# Patient Record
Sex: Female | Born: 1985 | Race: White | Hispanic: No | Marital: Married | Smoking: Never smoker
Health system: Southern US, Community
[De-identification: ages and names within clinical notes are randomized; demographics above are authoritative.]

## PROBLEM LIST (undated history)

## (undated) ENCOUNTER — Inpatient Hospital Stay: Payer: Self-pay

---
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
## 2006-02-16 ENCOUNTER — Inpatient Hospital Stay: Payer: Self-pay | Admitting: Obstetrics and Gynecology

## 2008-02-23 ENCOUNTER — Emergency Department: Payer: Self-pay | Admitting: Emergency Medicine

## 2008-03-03 ENCOUNTER — Ambulatory Visit: Payer: Self-pay

## 2010-05-03 ENCOUNTER — Inpatient Hospital Stay (HOSPITAL_COMMUNITY)
Admission: AD | Admit: 2010-05-03 | Discharge: 2010-05-03 | Disposition: A | Discharge status: Home / Self Care | Payer: Medicaid Other | Source: Ambulatory Visit | Attending: Obstetrics and Gynecology | Admitting: Obstetrics and Gynecology

## 2010-05-03 DIAGNOSIS — R109 Unspecified abdominal pain: Secondary | ICD-10-CM | POA: Insufficient documentation

## 2010-05-03 DIAGNOSIS — O4700 False labor before 37 completed weeks of gestation, unspecified trimester: Secondary | ICD-10-CM | POA: Insufficient documentation

## 2010-05-07 ENCOUNTER — Inpatient Hospital Stay (HOSPITAL_COMMUNITY)
Admission: AD | Admit: 2010-05-07 | Discharge: 2010-05-08 | Disposition: A | Discharge status: Home / Self Care | Payer: Medicaid Other | Source: Ambulatory Visit | Attending: Obstetrics and Gynecology | Admitting: Obstetrics and Gynecology

## 2010-05-07 DIAGNOSIS — O4700 False labor before 37 completed weeks of gestation, unspecified trimester: Secondary | ICD-10-CM | POA: Insufficient documentation

## 2010-05-28 ENCOUNTER — Inpatient Hospital Stay (HOSPITAL_COMMUNITY)
Admission: RE | Admit: 2010-05-28 | Discharge: 2010-05-29 | DRG: 775 | Disposition: A | Discharge status: Home / Self Care | Payer: Medicaid Other | Source: Ambulatory Visit | Attending: Obstetrics and Gynecology | Admitting: Obstetrics and Gynecology

## 2010-05-28 LAB — CBC
HCT: 33.9 % — ABNORMAL LOW (ref 36.0–46.0)
Hemoglobin: 10.9 g/dL — ABNORMAL LOW (ref 12.0–15.0)
RBC: 3.64 MIL/uL — ABNORMAL LOW (ref 3.87–5.11)
WBC: 8.7 10*3/uL (ref 4.0–10.5)

## 2010-05-28 LAB — ABO/RH: ABO/RH(D): O NEG

## 2010-05-28 LAB — RPR: RPR Ser Ql: NONREACTIVE

## 2010-05-29 LAB — CBC
HCT: 29.9 % — ABNORMAL LOW (ref 36.0–46.0)
Hemoglobin: 9.6 g/dL — ABNORMAL LOW (ref 12.0–15.0)
WBC: 10.3 10*3/uL (ref 4.0–10.5)

## 2010-06-03 ENCOUNTER — Inpatient Hospital Stay (HOSPITAL_COMMUNITY): Admission: AD | Admit: 2010-06-03 | Payer: Self-pay | Admitting: Obstetrics and Gynecology

## 2010-06-10 NOTE — Discharge Summary (Signed)
  Kimberly Riggs, Kimberly Riggs            ACCOUNT NO.:  [id number]  MEDICAL RECORD NO.:  [id number]           PATIENT TYPE:  I  LOCATION:  9119                          FACILITY:  [facility]  PHYSICIAN:  Huel Cote, M.D. DATE OF BIRTH:  [birth year]  DATE OF ADMISSION:  05/28/2010 DATE OF DISCHARGE:  05/29/2010                              DISCHARGE SUMMARY   DISCHARGE DIAGNOSES: 1. Term pregnancy at 39 weeks delivered. 2. Status post normal spontaneous vaginal delivery.  DISCHARGE MEDICATIONS:  Motrin 600 mg p.o. every 6 hours.  The patient received RhoGAM postpartum as per protocol.  DISCHARGE FOLLOWUP:  The patient is to follow up in the office in 6 weeks for her full postpartum exam where she may proceed with a Mirena IUD.  HOSPITAL COURSE:  The patient is a 25 year old G2, P 1-0-0-1 who was admitted at 39-1/7 weeks' gestation for induction of labor with advanced cervical dilation to 5 cm.  Her prenatal care was uneventful.  She received RhoGAM at 28 weeks for her Rh negative status and was rubella equivocal.  Her prenatal labs are as follows; O negative, antibody negative, rubella equivocal, RPR nonreactive, hepatitis B surface antigen negative, HIV negative, GC negative, Chlamydia negative, cystic fibrosis negative. First trimester screen was normal, 1-hour Glucola normal at 132.  Alpha- fetoprotein normal and group B strep negative.  The patient's past obstetrical history was significant for a vaginal delivery in 2007, 6-pound 12-ounce infant.  PAST GYN HISTORY:  Remote history of abnormal Pap smears which resolved.  PAST SURGICAL HISTORY:  None.  PAST MEDICAL HISTORY:  None.  ALLERGIES:  None.  MEDICATIONS:  Prenatal vitamins.  SOCIAL HISTORY:  She is married with no tobacco, alcohol or drugs.  FAMILY HISTORY:  She has a history of diabetes in the family.  On admission, fetal heart rate is reactive.  Cervix was 80, 5-6, and -1 station.  She had rupture of membranes  performed and received an epidural shortly thereafter.  After her epidural, She progressed rapidly to complete, pushed well for about 30 minutes with a normal spontaneous vaginal delivery of a viable female infant over small second-degree laceration.  Apgars were 8 and 9, weight was 7 pounds 12 ounces. Placenta delivery was spontaneous.  Second-degree laceration was repaired with 3-0 Vicryl Rapide and she was given a local block of 1% plain Lidocaine to help with pain control.  Cervix and rectum intact. Estimated blood loss was 350 mL.  She had a normal postpartum course and on postpartum day #1 was feeling well.  Hemoglobin was 9.6.  Fundus was firm and lochia decreased.  She felt stable for discharge home and requested early discharge if her baby was able to go.  She was given prescriptions for Motrin and advised on follow up in 6 weeks for her postpartum exam.  She was also given instructions on pelvic rest and will call to make her appointment for her 6-week checkup.     Huel Cote, M.D.     KR/MEDQ  D:  05/29/2010  T:  05/29/2010  Job:  [id number]  Electronically Signed by Huel Cote M.D. on 06/09/2010 10:47:30 AM

## 2012-11-26 ENCOUNTER — Inpatient Hospital Stay: Payer: Self-pay | Admitting: Obstetrics and Gynecology

## 2012-11-26 LAB — CBC WITH DIFFERENTIAL/PLATELET
Basophil %: 0.6 %
Eosinophil %: 0.3 %
HGB: 11.2 g/dL — ABNORMAL LOW (ref 12.0–16.0)
Lymphocyte #: 1.9 10*3/uL (ref 1.0–3.6)
Monocyte #: 0.6 x10 3/mm (ref 0.2–0.9)
Neutrophil #: 8.7 10*3/uL — ABNORMAL HIGH (ref 1.4–6.5)
Neutrophil %: 76.8 %
RDW: 15.3 % — ABNORMAL HIGH (ref 11.5–14.5)
WBC: 11.3 10*3/uL — ABNORMAL HIGH (ref 3.6–11.0)

## 2012-11-27 LAB — HEMATOCRIT: HCT: 33.4 % — ABNORMAL LOW (ref 35.0–47.0)

## 2014-10-19 LAB — OB RESULTS CONSOLE GC/CHLAMYDIA
CHLAMYDIA, DNA PROBE: NEGATIVE
GC PROBE AMP, GENITAL: NEGATIVE

## 2014-10-19 LAB — OB RESULTS CONSOLE HEPATITIS B SURFACE ANTIGEN: Hepatitis B Surface Ag: NEGATIVE

## 2014-10-19 LAB — OB RESULTS CONSOLE HIV ANTIBODY (ROUTINE TESTING): HIV: NONREACTIVE

## 2014-10-19 LAB — OB RESULTS CONSOLE RPR: RPR: NONREACTIVE

## 2015-02-25 NOTE — L&D Delivery Note (Signed)
Delivery Note At 12:11 AM a viable female was delivered via Vaginal, Spontaneous Delivery (Presentation:OA;LOA).  APGAR: 8,9; weight 3010g.   Placenta status: Intact, Spontaneous.  Cord: 3 vessels with the following complications: None.  Cord pH: NA  Called to see patient.  Mom pushed to delivery viable female infant.  The head followed by shoulders, which delivered without difficulty, and the rest of the body.  No nuchal cord noted.  Baby to mom's chest.  Cord clamped and cut after > 5 min delay.  Cord blood obtained.  Placenta delivered spontaneously, intact, with a 3-vessel cord.  Small first degree laceration hemostatic without repair.  All counts correct.  Hemostasis obtained with IV pitocin and fundal massage. EBL 100 mL.    Anesthesia: None  Episiotomy: None  Lacerations: small hemostatic 1st degree no repair Suture Repair: NA Est. Blood Loss (mL): 100  Mom to postpartum.  Baby to Couplet care / Skin to Skin.  Tresea MallGLEDHILL,Liston Thum, CNM  This patient and plan were discussed with Dr Elesa MassedWard 06/04/2015

## 2015-04-11 ENCOUNTER — Ambulatory Visit
Admission: RE | Admit: 2015-04-11 | Discharge: 2015-04-11 | Disposition: A | Discharge status: Home / Self Care | Payer: BLUE CROSS/BLUE SHIELD | Source: Ambulatory Visit | Attending: Obstetrics & Gynecology | Admitting: Obstetrics & Gynecology

## 2015-04-11 ENCOUNTER — Encounter: Payer: Self-pay | Admitting: Radiology

## 2015-04-11 ENCOUNTER — Other Ambulatory Visit: Payer: Self-pay | Admitting: Obstetrics & Gynecology

## 2015-04-11 DIAGNOSIS — D72829 Elevated white blood cell count, unspecified: Secondary | ICD-10-CM | POA: Insufficient documentation

## 2015-04-11 DIAGNOSIS — R1031 Right lower quadrant pain: Secondary | ICD-10-CM

## 2015-04-11 DIAGNOSIS — Z3A29 29 weeks gestation of pregnancy: Secondary | ICD-10-CM | POA: Diagnosis not present

## 2015-04-11 DIAGNOSIS — R1030 Lower abdominal pain, unspecified: Secondary | ICD-10-CM | POA: Insufficient documentation

## 2015-05-18 ENCOUNTER — Encounter: Payer: Self-pay | Admitting: *Deleted

## 2015-05-18 ENCOUNTER — Observation Stay
Admission: EM | Admit: 2015-05-18 | Discharge: 2015-05-18 | Disposition: A | Discharge status: Home / Self Care | Payer: BLUE CROSS/BLUE SHIELD | Attending: Obstetrics & Gynecology | Admitting: Obstetrics & Gynecology

## 2015-05-18 DIAGNOSIS — Z3A35 35 weeks gestation of pregnancy: Secondary | ICD-10-CM | POA: Diagnosis not present

## 2015-05-18 DIAGNOSIS — O09213 Supervision of pregnancy with history of pre-term labor, third trimester: Secondary | ICD-10-CM | POA: Insufficient documentation

## 2015-05-18 MED ORDER — ACETAMINOPHEN 325 MG PO TABS: 650.0000 mg | ORAL_TABLET | ORAL | Status: DC | PRN

## 2015-05-18 MED ORDER — ONDANSETRON HCL 4 MG/2ML IJ SOLN: 4.0000 mg | Freq: Four times a day (QID) | INTRAMUSCULAR | Status: DC | PRN

## 2015-05-18 NOTE — Final Progress Note (Signed)
Physician Final Progress Note  Patient ID: Kimberly BollmanHeather N Ishida MRN: [id number] DOB/AGE: [birth year] 28 y.o.  Admit date: 05/18/2015 Admitting provider: Nadara Mustardobert P Asuzena Weis, MD Discharge date: 05/18/2015  Admission Diagnoses: Advanced Cervical Dilation, Contractions, Preterm Labor  Discharge Diagnoses:  Principal Problem:   Preterm labor in second trimester with preterm delivery in third trimester   Consults: None  Significant Findings/ Diagnostic Studies: 30 yo G4P3 at 35 weeks with ctxs today and ACD in office (3/50/-2). Prior term NSVD, but has had ACD at 34-37 weeks before (5cm last preg, delivered at 38 weeks.) No bleeding, ROM; ctxs mild (4/10). Exam here- irreg ctx pattern, but at times q 3-6 min apart    Cervix unchanged    FHT- 140s, see NST note  Procedures: A NST procedure was performed with FHR monitoring and a normal baseline established, appropriate time of 20-40 minutes of evaluation, and accels >2 seen w 15x15 characteristics.  Results show a REACTIVE NST.   Discharge Condition: good  Disposition: Weekly f/u and PRN  Diet: Regular diet  Discharge Activity: Activity as tolerated Monitor for signs or symptoms of labor    Medication List    Notice    Prenatal Vitamins.      Total time spent taking care of this patient: 15 minutes NST done  Signed: Letitia Libraobert Paul Odean Fester 05/18/2015, 4:24 PM

## 2015-05-18 NOTE — Plan of Care (Signed)
Discharge instructions, both oral and written, given to pt. Labor precautions discussed at length per MD and RN. Pt agrees with plan of care. [facility]. Pt leaving dept in stable condition ambulatory.  Ellison Carwin Damya Comley RNC

## 2015-05-18 NOTE — OB Triage Note (Signed)
[facility] with labor contractions 4 x hour per pt. She is 35 weeks and 3 cm,  50, -1 per C G CNM at 2 pm today. Pt is a G4 P3 pt EDC April 28. 35.0 weeks. Denies spotting, leaking. Active fetal movement per pt. And felt per RN at this time. Ellison Carwin Kimberly Riggs RNC

## 2015-05-18 NOTE — Discharge Instructions (Signed)
[facility].  Dr Tiburcio PeaHarris states to keep that appt. Labor procautions discussed at length per MD and RN. Pt agrees with plan of care and will call if any questions or concerns. Pt ready to leave dept in stable condition ambulatory. C Alyra Patty RND

## 2015-05-25 LAB — OB RESULTS CONSOLE GBS: STREP GROUP B AG: NEGATIVE

## 2015-06-03 ENCOUNTER — Inpatient Hospital Stay
Admission: EM | Admit: 2015-06-03 | Discharge: 2015-06-06 | DRG: 767 | Disposition: A | Discharge status: Home / Self Care | Payer: BLUE CROSS/BLUE SHIELD | Attending: Obstetrics & Gynecology | Admitting: Obstetrics & Gynecology

## 2015-06-03 DIAGNOSIS — Z302 Encounter for sterilization: Secondary | ICD-10-CM | POA: Diagnosis not present

## 2015-06-03 DIAGNOSIS — D62 Acute posthemorrhagic anemia: Secondary | ICD-10-CM | POA: Diagnosis not present

## 2015-06-03 DIAGNOSIS — O9081 Anemia of the puerperium: Secondary | ICD-10-CM | POA: Diagnosis not present

## 2015-06-03 DIAGNOSIS — Z3483 Encounter for supervision of other normal pregnancy, third trimester: Secondary | ICD-10-CM | POA: Diagnosis present

## 2015-06-03 LAB — CBC
HCT: 30.4 % — ABNORMAL LOW (ref 35.0–47.0)
Hemoglobin: 10.4 g/dL — ABNORMAL LOW (ref 12.0–16.0)
MCH: 31 pg (ref 26.0–34.0)
MCHC: 34.2 g/dL (ref 32.0–36.0)
MCV: 90.8 fL (ref 80.0–100.0)
PLATELETS: 206 10*3/uL (ref 150–440)
RBC: 3.34 MIL/uL — AB (ref 3.80–5.20)
RDW: 14.8 % — ABNORMAL HIGH (ref 11.5–14.5)
WBC: 9.9 10*3/uL (ref 3.6–11.0)

## 2015-06-03 MED ORDER — AMMONIA AROMATIC IN INHA
RESPIRATORY_TRACT | Status: AC
  Filled 2015-06-03: qty 10

## 2015-06-03 MED ORDER — LACTATED RINGERS IV SOLN: 500.0000 mL | INTRAVENOUS | Status: DC | PRN

## 2015-06-03 MED ORDER — OXYTOCIN BOLUS FROM INFUSION
500.0000 mL | INTRAVENOUS | Status: DC
  Administered 2015-06-04: 500 mL via INTRAVENOUS

## 2015-06-03 MED ORDER — LIDOCAINE HCL (PF) 1 % IJ SOLN
INTRAMUSCULAR | Status: AC
  Filled 2015-06-03: qty 30

## 2015-06-03 MED ORDER — ACETAMINOPHEN 325 MG PO TABS: 650.0000 mg | ORAL_TABLET | ORAL | Status: DC | PRN

## 2015-06-03 MED ORDER — LIDOCAINE HCL (PF) 1 % IJ SOLN: 30.0000 mL | INTRAMUSCULAR | Status: DC | PRN

## 2015-06-03 MED ORDER — BUTORPHANOL TARTRATE 1 MG/ML IJ SOLN: 1.0000 mg | INTRAMUSCULAR | Status: DC | PRN

## 2015-06-03 MED ORDER — MISOPROSTOL 200 MCG PO TABS
ORAL_TABLET | ORAL | Status: AC
  Filled 2015-06-03: qty 4

## 2015-06-03 MED ORDER — OXYTOCIN 40 UNITS IN LACTATED RINGERS INFUSION - SIMPLE MED
2.5000 [IU]/h | INTRAVENOUS | Status: DC
  Filled 2015-06-03: qty 1000

## 2015-06-03 MED ORDER — ONDANSETRON HCL 4 MG/2ML IJ SOLN: 4.0000 mg | Freq: Four times a day (QID) | INTRAMUSCULAR | Status: DC | PRN

## 2015-06-03 MED ORDER — LACTATED RINGERS IV SOLN
INTRAVENOUS | Status: DC
  Administered 2015-06-03: 22:00:00 via INTRAVENOUS

## 2015-06-03 MED ORDER — OXYTOCIN 10 UNIT/ML IJ SOLN
INTRAMUSCULAR | Status: AC
  Filled 2015-06-03: qty 2

## 2015-06-03 NOTE — H&P (Signed)
OB History & Physical   History of Present Illness:  Chief Complaint: Contractions  HPI:  Kimberly Riggs is a 30 y.o. [id number] female at 6984w2d dated by 8 wk US.  Her pregnancy has been without complication.    She reports contractions.   She denies leakage of fluid.   She denies vaginal bleeding.   She reports fetal movement.    Maternal Medical History:  History reviewed. No pertinent past medical history.  History reviewed. No pertinent past surgical history.  No Known Allergies  Prior to Admission medications   Not on File    OB History  Gravida Para Term Preterm AB SAB TAB Ectopic Multiple Living  4 3 3  0 0 0 0 0 0 1    # Outcome Date GA Lbr Len/2nd Weight Sex Delivery Anes PTL Lv  4 Current           3 Term     M Vag-Spont   Y  2 Term     M Vag-Spont     1 Term     F Vag-Spont         Prenatal care site: [facility]  Social History: She  reports that she has never smoked. She does not have any smokeless tobacco history on file.  Family History: family history is not on file.   Review of Systems: Negative x 10 systems reviewed except as noted in the HPI.    Physical Exam:  Vital Signs: BP 129/73 mmHg  Pulse 100  Temp(Src) 97.8 F (36.6 C)  Resp 18  Ht 5\' 4"  (1.626 m)  Wt 76.204 kg (168 lb)  BMI 28.82 kg/m2  LMP 09/15/2014 General: no acute distress.  HEENT: normocephalic, atraumatic Heart: regular rate & rhythm.  No murmurs/rubs/gallops Lungs: clear to auscultation bilaterally Abdomen: soft, gravid, non-tender;  EFW: 7.5 pounds Pelvic:   External: Normal external female genitalia  Cervix:  6 /  100 /  -1    Extremities: non-tender, symmetric, No edema bilaterally.  DTRs: 2+ bilaterally Neurologic: Alert & oriented x 3.    Pertinent Results:  Prenatal Labs: Blood type/Rh O negative  Antibody screen negative  Rubella Not immune  Varicella Not immune    RPR Non Reactive  HBsAg negative  HIV negative  GC negative  Chlamydia negative   Genetic screening Negative  1 hour GTT 138  3 hour GTT NA  GBS negative on 3/31   Baseline FHR: 140 beats/min   Variability: moderate   Accelerations: present   Decelerations: absent Contractions: present frequency: every 2-3 minutes Overall assessment: Cat I   Assessment:  Kimberly Riggs is a 30 y.o. 574P3003 female at 9184w2d in active labor.   Plan:  1. Admit to Labor & Delivery  2. CBC, T&S, Clrs, IVF 3. GBS negative.   4. Fetal well-being: Cat I 5. Expectant management for vaginal birth   Tresea MallGLEDHILL,Kevyn Wengert, CNM  This patient and plan were discussed with Dr Elesa MassedWard 06/03/2015

## 2015-06-03 NOTE — OB Triage Note (Signed)
Patient came in with complaint of contractions that started last night around 8:00. Patient rates pain  2 out of 10. Patient states she has been feeling baby move. Patient denies vaginal bleeding and discharge. Patient afebrile and vital signs stable. Husband at bedside. Will continue to monitor.

## 2015-06-04 ENCOUNTER — Encounter: Payer: Self-pay | Admitting: *Deleted

## 2015-06-04 LAB — CBC
HCT: 31 % — ABNORMAL LOW (ref 35.0–47.0)
Hemoglobin: 10.5 g/dL — ABNORMAL LOW (ref 12.0–16.0)
MCH: 31.2 pg (ref 26.0–34.0)
MCHC: 33.9 g/dL (ref 32.0–36.0)
MCV: 92.1 fL (ref 80.0–100.0)
PLATELETS: 201 10*3/uL (ref 150–440)
RBC: 3.37 MIL/uL — AB (ref 3.80–5.20)
RDW: 15.4 % — AB (ref 11.5–14.5)
WBC: 17 10*3/uL — AB (ref 3.6–11.0)

## 2015-06-04 LAB — TYPE AND SCREEN
ABO/RH(D): O NEG
Antibody Screen: POSITIVE

## 2015-06-04 MED ORDER — OXYCODONE HCL 5 MG PO TABS: 10.0000 mg | ORAL_TABLET | ORAL | Status: DC | PRN

## 2015-06-04 MED ORDER — ONDANSETRON HCL 4 MG PO TABS: 4.0000 mg | ORAL_TABLET | ORAL | Status: DC | PRN

## 2015-06-04 MED ORDER — DIBUCAINE 1 % RE OINT: 1.0000 "application " | TOPICAL_OINTMENT | RECTAL | Status: DC | PRN

## 2015-06-04 MED ORDER — SIMETHICONE 80 MG PO CHEW: 80.0000 mg | CHEWABLE_TABLET | ORAL | Status: DC | PRN

## 2015-06-04 MED ORDER — SODIUM CHLORIDE 0.9% FLUSH
3.0000 mL | Freq: Four times a day (QID) | INTRAVENOUS | Status: DC
  Administered 2015-06-04 – 2015-06-05 (×5): 3 mL via INTRAVENOUS

## 2015-06-04 MED ORDER — WITCH HAZEL-GLYCERIN EX PADS: 1.0000 "application " | MEDICATED_PAD | CUTANEOUS | Status: DC | PRN

## 2015-06-04 MED ORDER — OXYCODONE HCL 5 MG PO TABS: 5.0000 mg | ORAL_TABLET | ORAL | Status: DC | PRN

## 2015-06-04 MED ORDER — LANOLIN HYDROUS EX OINT: TOPICAL_OINTMENT | CUTANEOUS | Status: DC | PRN

## 2015-06-04 MED ORDER — ACETAMINOPHEN 325 MG PO TABS: 650.0000 mg | ORAL_TABLET | ORAL | Status: DC | PRN

## 2015-06-04 MED ORDER — DIPHENHYDRAMINE HCL 25 MG PO CAPS: 25.0000 mg | ORAL_CAPSULE | Freq: Four times a day (QID) | ORAL | Status: DC | PRN

## 2015-06-04 MED ORDER — SENNOSIDES-DOCUSATE SODIUM 8.6-50 MG PO TABS
2.0000 | ORAL_TABLET | ORAL | Status: DC
  Administered 2015-06-04 – 2015-06-06 (×2): 2 via ORAL
  Filled 2015-06-04 (×2): qty 2

## 2015-06-04 MED ORDER — IBUPROFEN 600 MG PO TABS
600.0000 mg | ORAL_TABLET | Freq: Four times a day (QID) | ORAL | Status: DC
  Administered 2015-06-04 – 2015-06-06 (×8): 600 mg via ORAL
  Filled 2015-06-04 (×8): qty 1

## 2015-06-04 MED ORDER — ONDANSETRON HCL 4 MG/2ML IJ SOLN
4.0000 mg | INTRAMUSCULAR | Status: DC | PRN
  Administered 2015-06-05: 4 mg via INTRAVENOUS

## 2015-06-04 MED ORDER — PRENATAL MULTIVITAMIN CH
1.0000 | ORAL_TABLET | Freq: Every day | ORAL | Status: DC
  Administered 2015-06-04: 1 via ORAL
  Filled 2015-06-04: qty 1

## 2015-06-04 MED ORDER — BENZOCAINE-MENTHOL 20-0.5 % EX AERO
1.0000 | INHALATION_SPRAY | CUTANEOUS | Status: DC | PRN
Start: 2015-06-04 — End: 2015-06-06
  Administered 2015-06-04: 1 via TOPICAL
  Filled 2015-06-04: qty 56

## 2015-06-04 NOTE — Discharge Summary (Addendum)
OB Discharge Summary  Patient Name: Kimberly Riggs DOB: Oct 19, 1985 MRN: [id number]  Date of admission: 06/03/2015 Delivering MD: Tresea Mall, CNM Date of Delivery: 06/04/2015  Date of discharge: 06/06/2015  Admitting diagnosis: contractions DESIRES PERMANENT STERILITY, active labor Intrauterine pregnancy: [redacted]w[redacted]d     Secondary diagnosis: None     Discharge diagnosis: Term Pregnancy Delivered                                                                                                Post partum procedures:postpartum tubal ligation  Augmentation: AROM  Complications: None  Hospital course:  Onset of Labor With Vaginal Delivery     30 y.o. yo [phone] at [redacted]w[redacted]d was admitted in Active Labor on 06/03/2015. Patient had an uncomplicated labor course as follows:  Membrane Rupture Time/Date: 9:38 PM ,06/03/2015   Intrapartum Procedures:  Episiotomy: None  Lacerations:  1st degree without repair  Patient had a delivery of a Viable infant 06/04/2015 Details of the delivery are found in a separate delivery note  Pateint had an uncomplicated postpartum course.  She is ambulating, tolerating a regular diet, passing flatus, and urinating well. Patient is discharged home in stable condition on 4/12. Marland Kitchen    Physical exam  Filed Vitals:   06/05/15 1820 06/05/15 1938 06/05/15 2012 06/06/15 0730  BP: 121/78 123/75 112/69 115/69  Pulse: 72 79 84 79  Temp: 98 F (36.7 C) 97.9 F (36.6 C) 98.7 F (37.1 C) 98.2 F (36.8 C)  TempSrc: Oral Oral Oral Oral  Resp: Height:      Weight:      SpO2: 100% 98% 99% 100%   General: alert, cooperative and no distress Lochia: appropriate Uterine Fundus: firm Incision: c/d/i with dermabond over it, no e/o infection of separation DVT Evaluation: No evidence of DVT seen on physical exam.  Labs: Lab Results  Component Value Date   WBC 17.0* 06/04/2015   HGB 10.5* 06/04/2015   HCT 31.0* 06/04/2015   MCV 92.1 06/04/2015   PLT 201  06/04/2015   No flowsheet data found.  Discharge instruction: per After Visit Summary.  Medications:    Medication List    TAKE these medications        docusate sodium 100 MG capsule  Commonly known as:  COLACE  Take 1 capsule (100 mg total) by mouth 2 (two) times daily.     ibuprofen 600 MG tablet  Commonly known as:  ADVIL,MOTRIN  Take 1 tablet (600 mg total) by mouth every 6 (six) hours as needed for headache, mild pain or cramping.     oxyCODONE 5 MG immediate release tablet  Commonly known as:  Oxy IR/ROXICODONE  Take 1 tablet (5 mg total) by mouth every 6 (six) hours as needed for severe pain.     prenatal multivitamin Tabs tablet  Take 1 tablet by mouth daily at 12 noon.        Diet: routine diet  Activity: Advance as tolerated. Pelvic rest for 6 weeks.   Outpatient follow up: No Follow-up on file.  Postpartum contraception: Tubal Ligation Rhogam Given  postpartum: no Rubella vaccine given postpartum: no Varicella vaccine given postpartum: no TDaP given antepartum or postpartum: TDaP given AP  Newborn Data: Live born female Birth Weight: 3010g APGAR:8,9    Baby Feeding: Breast  Disposition:home with mother  Cornelia Copaharlie Blayden Conwell, Jr MD [facility]  Pager: [phone]

## 2015-06-04 NOTE — Progress Notes (Signed)
30 year old G4 P4 s/p SVD earlier this AM-PPD0  S: Tired. Trying to breast feed-baby also sleppy Voided several times since delivery. Tolerating regular diet Bleeding WNL. Wants ppBTL  O: BP 109/63 mmHg  Pulse 84  Temp(Src) 98.4 F (36.9 C) (Oral)  Resp 15  Ht 5\' 4"  (1.626 m)  Wt 76.204 kg (168 lb)  BMI 28.82 kg/m2  SpO2 100%  LMP 09/15/2014  Breastfeeding? Unknown  General: tired, no acute distress, asking appropriate questions Results for orders placed or performed during the hospital encounter of 06/03/15 (from the past 24 hour(s))  CBC     Status: Abnormal   Collection Time: 06/03/15 10:08 PM  Result Value Ref Range   WBC 9.9 3.6 - 11.0 K/uL   RBC 3.34 (L) 3.80 - 5.20 MIL/uL   Hemoglobin 10.4 (L) 12.0 - 16.0 g/dL   HCT 45.430.4 (L) 09.835.0 - 11.947.0 %   MCV 90.8 80.0 - 100.0 fL   MCH 31.0 26.0 - 34.0 pg   MCHC 34.2 32.0 - 36.0 g/dL   RDW 14.714.8 (H) 82.911.5 - 56.214.5 %   Platelets 206 150 - 440 K/uL  Type and screen [facility]     Status: None   Collection Time: 06/03/15 10:08 PM  Result Value Ref Range   ABO/RH(D) O NEG    Antibody Screen POS    Sample Expiration 06/06/2015    Antibody Identification PASSIVELY ACQUIRED ANTI-D   CBC     Status: Abnormal   Collection Time: 06/04/15  4:50 AM  Result Value Ref Range   WBC 17.0 (H) 3.6 - 11.0 K/uL   RBC 3.37 (L) 3.80 - 5.20 MIL/uL   Hemoglobin 10.5 (L) 12.0 - 16.0 g/dL   HCT 13.031.0 (L) 86.535.0 - 78.447.0 %   MCV 92.1 80.0 - 100.0 fL   MCH 31.2 26.0 - 34.0 pg   MCHC 33.9 32.0 - 36.0 g/dL   RDW 69.615.4 (H) 29.511.5 - 28.414.5 %   Platelets 201 150 - 440 K/uL   A: stable PPD 0 Desires PPBTL  P: Scheduled for ppBTL in AM. NPO after midnight Saline lock IV and flush q6hr Counseled on permanence of BTL, failure rate, risk of ectopic if becomes pregnant, and risks of bleeding, infection, injury to other organs in the abdomen like bowel or bladder. Also discussed recovery time Patient wishes to proceed with surgery in  AM.  Lanie Schelling, CNM

## 2015-06-05 ENCOUNTER — Inpatient Hospital Stay: Payer: BLUE CROSS/BLUE SHIELD | Admitting: Anesthesiology

## 2015-06-05 ENCOUNTER — Encounter
Admission: EM | Disposition: A | Discharge status: Home / Self Care | Payer: Self-pay | Source: Home / Self Care | Attending: Obstetrics & Gynecology

## 2015-06-05 ENCOUNTER — Encounter: Payer: Self-pay | Admitting: *Deleted

## 2015-06-05 DIAGNOSIS — Z302 Encounter for sterilization: Secondary | ICD-10-CM

## 2015-06-05 HISTORY — PX: TUBAL LIGATION: SHX77

## 2015-06-05 LAB — RPR: RPR: NONREACTIVE

## 2015-06-05 SURGERY — LIGATION, FALLOPIAN TUBE, POSTPARTUM
Anesthesia: General | Site: Abdomen | Wound class: Clean Contaminated

## 2015-06-05 MED ORDER — PHENYLEPHRINE HCL 10 MG/ML IJ SOLN
INTRAMUSCULAR | Status: DC | PRN
  Administered 2015-06-05: 100 ug via INTRAVENOUS

## 2015-06-05 MED ORDER — FENTANYL CITRATE (PF) 100 MCG/2ML IJ SOLN
25.0000 ug | INTRAMUSCULAR | Status: DC | PRN
  Administered 2015-06-05 (×4): 25 ug via INTRAVENOUS

## 2015-06-05 MED ORDER — BUPIVACAINE-EPINEPHRINE 0.25% -1:200000 IJ SOLN
INTRAMUSCULAR | Status: DC | PRN
  Administered 2015-06-05: 5 mL

## 2015-06-05 MED ORDER — LACTATED RINGERS IV SOLN
INTRAVENOUS | Status: DC
  Administered 2015-06-05 (×2): via INTRAVENOUS

## 2015-06-05 MED ORDER — DEXAMETHASONE SODIUM PHOSPHATE 10 MG/ML IJ SOLN
INTRAMUSCULAR | Status: DC | PRN
  Administered 2015-06-05: 10 mg via INTRAVENOUS

## 2015-06-05 MED ORDER — PROPOFOL 10 MG/ML IV BOLUS
INTRAVENOUS | Status: DC | PRN
  Administered 2015-06-05: 160 mg via INTRAVENOUS

## 2015-06-05 MED ORDER — MIDAZOLAM HCL 2 MG/2ML IJ SOLN
INTRAMUSCULAR | Status: DC | PRN
Start: 2015-06-05 — End: 2015-06-05
  Administered 2015-06-05: 2 mg via INTRAVENOUS

## 2015-06-05 MED ORDER — KETOROLAC TROMETHAMINE 30 MG/ML IJ SOLN
INTRAMUSCULAR | Status: DC | PRN
  Administered 2015-06-05: 30 mg via INTRAVENOUS

## 2015-06-05 MED ORDER — FENTANYL CITRATE (PF) 100 MCG/2ML IJ SOLN
INTRAMUSCULAR | Status: AC
  Administered 2015-06-05: 25 ug via INTRAVENOUS
  Filled 2015-06-05: qty 2

## 2015-06-05 MED ORDER — ONDANSETRON HCL 4 MG/2ML IJ SOLN: 4.0000 mg | Freq: Once | INTRAMUSCULAR | Status: DC | PRN

## 2015-06-05 MED ORDER — ROCURONIUM BROMIDE 100 MG/10ML IV SOLN
INTRAVENOUS | Status: DC | PRN
  Administered 2015-06-05: 10 mg via INTRAVENOUS
  Administered 2015-06-05: 30 mg via INTRAVENOUS

## 2015-06-05 MED ORDER — FENTANYL CITRATE (PF) 100 MCG/2ML IJ SOLN
INTRAMUSCULAR | Status: DC | PRN
  Administered 2015-06-05: 50 ug via INTRAVENOUS
  Administered 2015-06-05: 100 ug via INTRAVENOUS

## 2015-06-05 MED ORDER — SUCCINYLCHOLINE CHLORIDE 20 MG/ML IJ SOLN
INTRAMUSCULAR | Status: DC | PRN
  Administered 2015-06-05: 100 mg via INTRAVENOUS

## 2015-06-05 MED ORDER — SUGAMMADEX SODIUM 200 MG/2ML IV SOLN
INTRAVENOUS | Status: DC | PRN
  Administered 2015-06-05: 152.4 mg via INTRAVENOUS

## 2015-06-05 SURGICAL SUPPLY — 25 items
BLADE SURG 11 STRL SS SAFETY (MISCELLANEOUS) ×3 IMPLANT
CHLORAPREP W/TINT 26ML (MISCELLANEOUS) ×3 IMPLANT
DRAPE LAPAROTOMY 100X77 ABD (DRAPES) ×3 IMPLANT
ELECT CAUTERY BLADE 6.4 (BLADE) ×3 IMPLANT
ELECT REM PT RETURN 9FT ADLT (ELECTROSURGICAL) ×3
ELECTRODE REM PT RTRN 9FT ADLT (ELECTROSURGICAL) ×1 IMPLANT
GLOVE BIO SURGEON STRL SZ7 (GLOVE) ×12 IMPLANT
GLOVE BIOGEL PI IND STRL 7.5 (GLOVE) ×4 IMPLANT
GLOVE BIOGEL PI INDICATOR 7.5 (GLOVE) ×8
GOWN STRL REUS W/ TWL LRG LVL3 (GOWN DISPOSABLE) ×3 IMPLANT
GOWN STRL REUS W/ TWL XL LVL3 (GOWN DISPOSABLE) ×1 IMPLANT
GOWN STRL REUS W/TWL LRG LVL3 (GOWN DISPOSABLE) ×6
GOWN STRL REUS W/TWL XL LVL3 (GOWN DISPOSABLE) ×2
KIT RM TURNOVER CYSTO AR (KITS) ×3 IMPLANT
LABEL OR SOLS (LABEL) IMPLANT
LIQUID BAND (GAUZE/BANDAGES/DRESSINGS) ×3 IMPLANT
NDL SAFETY 22GX1.5 (NEEDLE) ×3 IMPLANT
NS IRRIG 500ML POUR BTL (IV SOLUTION) ×3 IMPLANT
PACK BASIN MINOR ARMC (MISCELLANEOUS) ×3 IMPLANT
SUT PLAIN GUT 0 (SUTURE) ×6 IMPLANT
SUT VIC AB 2-0 UR6 27 (SUTURE) ×3 IMPLANT
SUT VIC AB 3-0 SH 27 (SUTURE) ×2
SUT VIC AB 3-0 SH 27X BRD (SUTURE) ×1 IMPLANT
SUT VIC AB 4-0 FS2 27 (SUTURE) ×3 IMPLANT
SYRINGE 10CC LL (SYRINGE) ×3 IMPLANT

## 2015-06-05 NOTE — Op Note (Signed)
Op Note Postpartum Tubal Ligation  Pre-Op Diagnosis: multiparity, desires permanent sterilization  Post-Op Diagnosis: multiparity, desires permanent sterilization  Procedures:  Postpartum tubal ligation via Pomeroy method  Primary Surgeon: Thomasene MohairStephen Crystle Carelli, MD  EBL: 10 ml   IVF: 1,000 mL   Specimens: portion of right and left fallopian tubes  Drains: None  Complications: None   Disposition: PACU   Condition: Stable   Findings: normal appearing bilateral fallopian tubes  Indication: The patient is a 30 y.o. [id number] who is postpartum day 1 status post spontaneous vaginal delivery.  She has been counseled extensively regarding risks, benefits, and alternatives to tubal ligation, including non-permanent forms of contraception that are equivalent in efficacy with potentially better side effects.  She has been advised that there is a failure rate of 3-5 in every 1,000 tubal ligations per year with an increased risk of ectopic pregnancy should pregnancy occur.   Procedure Summary:  The patient was taken to the operating room where general anesthesia was administered and found to be adequate. After timeout was called a small transverse, infraumbilical incision was made with the scalpel. The incision was carried down through the fascia until the peritoneum was identified and entered. The peritoneum was noted to be free of any adhesions and the incision was then extended.  The patient's right fallopian tube was identified, brought incision, and grasped with a Babcock clamp. The tube was then followed out to the fimbria. The Babcock clamp was then used to grasp the tube approximately 4 cm from the cornual region. A 3 cm segment of tube was then ligated with the 2 free ties of plain gut, and excised. Good hemostasis was noted and the tube was returned to the abdomen. The left fallopian tube was then ligated, and a 3 cm segment excised in a similar fashion. Excellent hemostasis was noted, and the  tube returned to the abdomen.  The peritoneum and fascia were closed in a single layer using 3-0 Vicryl. The skin was closed in a subcuticular fashion using 4-0 vicryl, undyed. The closure was also closed with Dermabond.  The patient tolerated the procedure well. Sponge, lap, and needle counts were correct 2. The patient was taken to the recovery room in stable condition.   Sponge, lap, needle, and instrument counts were correct x 2.  VTE prophylaxis: SCDs. Antibiotic prophylaxis: none indicated. The patient tolerated the procedure well and was taken to the PACU in stable condition.   Thomasene MohairStephen Pallavi Clifton, MD 06/05/2015 5:15 PM

## 2015-06-05 NOTE — Anesthesia Procedure Notes (Signed)
Procedure Name: Intubation Date/Time: 06/05/2015 4:23 PM Performed by: Junious SilkNOLES, Kimberly Wigley Pre-anesthesia Checklist: Patient identified, Patient being monitored, Timeout performed, Emergency Drugs available and Suction available Patient Re-evaluated:Patient Re-evaluated prior to inductionOxygen Delivery Method: Circle system utilized Preoxygenation: Pre-oxygenation with 100% oxygen Intubation Type: IV induction Ventilation: Mask ventilation without difficulty Laryngoscope Size: Mac and 3 Grade View: Grade I Tube type: Oral Tube size: 7.0 mm Number of attempts: 1 Airway Equipment and Method: Stylet Placement Confirmation: ETT inserted through vocal cords under direct vision,  positive ETCO2 and breath sounds checked- equal and bilateral Secured at: 21 cm Tube secured with: Tape Dental Injury: Teeth and Oropharynx as per pre-operative assessment

## 2015-06-05 NOTE — Progress Notes (Signed)
Patient ID: Kimberly Riggs, female   DOB: [birth year], 30 y.o.   MRN: [id number] Obstetric Postpartum Daily Progress Note Subjective:  30 y.o. [id number] postpartum day #1 status post vaginal delivery.  She is ambulating, is tolerating po but is NPO due to elective BTL today, is voiding spontaneously.  Her pain is well controlled on PO pain medications. Her lochia is less than menses.   Medications SCHEDULED MEDICATIONS  . ibuprofen  600 mg Oral 4 times per day  . prenatal multivitamin  1 tablet Oral Q1200  . senna-docusate  2 tablet Oral Q24H  . sodium chloride flush  3 mL Intravenous Q6H    MEDICATION INFUSIONS     PRN MEDICATIONS  acetaminophen, benzocaine-Menthol, witch hazel-glycerin **AND** dibucaine, diphenhydrAMINE, lanolin, ondansetron **OR** ondansetron (ZOFRAN) IV, oxyCODONE, oxyCODONE, simethicone    Objective:   Filed Vitals:   06/04/15 1531 06/04/15 1942 06/05/15 0012 06/05/15 0447  BP: 109/65 112/70 100/66 104/60  Pulse: 85 97 99 74  Temp: 97.7 F (36.5 C) 97.6 F (36.4 C) 98.2 F (36.8 C) 98.3 F (36.8 C)  TempSrc: Oral Oral Oral Oral  Resp: _0 Height:      Weight:      SpO2: 99% 99% 99% 99%    Current Vital Signs 24h Vital Sign Ranges  T 98.3 F (36.8 C) Temp  Avg: 98 F (36.7 C)  Min: 97.6 F (36.4 C)  Max: 98.3 F (36.8 C)  BP 104/60 mmHg BP  Min: 100/66  Max: 112/70  HR 74 Pulse  Avg: 89.2  Min: 74  Max: 99  RR 18 Resp  Avg: 16  Min: 13  Max: 18  SaO2 99 % Not Delivered SpO2  Avg: 99.2 %  Min: 99 %  Max: 100 %       24 Hour I/O Current Shift I/O  Time Ins Outs 04/10 0701 - 04/11 0700 In: 451 [P.O.:240; I.V.:211] Out: -     General: NAD Pulmonary: no increased work of breathing Abdomen: non-distended, non-tender, fundus firm at level of umbilicus Extremities: no edema, no erythema, no tenderness  Labs:   Recent Labs Lab 06/03/15 2208 06/04/15 0450  WBC 9.9 17.0*  HGB 10.4* 10.5*  HCT 30.4* 31.0*  PLT 206 201      Assessment:   30 y.o. [id number] postpartum day # 1 status post SVD, desires permanent sterilization.  Plan:   1) Acute blood loss anemia - hemodynamically stable and asymptomatic - po ferrous sulfate  2) O NEG / Rubella Non-immune (will need MMR)/ Varicella non Immune (will need TWO doses of varicella vaccine  3) TDAP status up to date  4) breast feeding /Contraception = bilateral tubal ligation is planned for today.  The risks of the procedure, including bleeding, infection, damage to nearby organs has been explained to her, as well as the risk of failure of the procedure (3-5 in every 1,000 surgeries) and increased risk of ectopic pregnancy should pregnancy occur. She is aware that there are several non-permanent alternative forms of contraception available.  She would like to proceed.  5) Disposition home tomorrow.  Prentice Docker, MD 06/05/2015 7:49 AM

## 2015-06-05 NOTE — Transfer of Care (Signed)
Immediate Anesthesia Transfer of Care Note  Patient: Kimberly Riggs  Procedure(s) Performed: Procedure(s): POST PARTUM TUBAL LIGATION (N/A)  Patient Location: PACU  Anesthesia Type:General  Level of Consciousness: sedated  Airway & Oxygen Therapy: Patient Spontanous Breathing and Patient connected to face mask oxygen  Post-op Assessment: Report given to RN and Post -op Vital signs reviewed and stable  Post vital signs: Reviewed and stable  Last Vitals:  Filed Vitals:   06/05/15 1155 06/05/15 1449  BP: 111/63 112/74  Pulse: 77 74  Temp: 36.7 C 36.7 C  Resp: 18 16    Complications: No apparent anesthesia complications

## 2015-06-05 NOTE — Anesthesia Preprocedure Evaluation (Signed)
Anesthesia Evaluation  Patient identified by MRN, date of birth, ID band Patient awake    Reviewed: Allergy & Precautions, NPO status , Patient's Chart, lab work & pertinent test results  Airway Mallampati: II  TM Distance: >3 FB Neck ROM: Full    Dental no notable dental hx.    Pulmonary neg pulmonary ROS,    Pulmonary exam normal breath sounds clear to auscultation       Cardiovascular negative cardio ROS Normal cardiovascular exam     Neuro/Psych negative neurological ROS  negative psych ROS   GI/Hepatic negative GI ROS, Neg liver ROS,   Endo/Other  negative endocrine ROS  Renal/GU negative Renal ROS  negative genitourinary   Musculoskeletal negative musculoskeletal ROS (+)   Abdominal Normal abdominal exam  (+)   Peds negative pediatric ROS (+)  Hematology negative hematology ROS (+)   Anesthesia Other Findings   Reproductive/Obstetrics Just delivered                             Anesthesia Physical Anesthesia Plan  ASA: II  Anesthesia Plan: General   Post-op Pain Management:    Induction: Intravenous  Airway Management Planned: Oral ETT  Additional Equipment:   Intra-op Plan:   Post-operative Plan: Extubation in OR  Informed Consent: I have reviewed the patients History and Physical, chart, labs and discussed the procedure including the risks, benefits and alternatives for the proposed anesthesia with the patient or authorized representative who has indicated his/her understanding and acceptance.   Dental advisory given  Plan Discussed with: CRNA and Surgeon  Anesthesia Plan Comments:         Anesthesia Quick Evaluation

## 2015-06-05 NOTE — Anesthesia Postprocedure Evaluation (Signed)
Anesthesia Post Note  Patient: Kimberly Riggs  Procedure(s) Performed: Procedure(s) (LRB): POST PARTUM TUBAL LIGATION (N/A)  Patient location during evaluation: PACU Anesthesia Type: General Level of consciousness: awake and alert Pain management: pain level controlled Vital Signs Assessment: post-procedure vital signs reviewed and stable Respiratory status: spontaneous breathing, nonlabored ventilation, respiratory function stable and patient connected to nasal cannula oxygen Cardiovascular status: blood pressure returned to baseline and stable Postop Assessment: no signs of nausea or vomiting Anesthetic complications: no    Last Vitals:  Filed Vitals:   06/05/15 1804 06/05/15 1820  BP: 110/76 121/78  Pulse: 65 72  Temp: 36.6 C 36.7 C  Resp: 12 14    Last Pain:  Filed Vitals:   06/05/15 1835  PainSc: 0-No pain                 Cleda MccreedyJoseph K Piscitello

## 2015-06-06 ENCOUNTER — Encounter: Payer: Self-pay | Admitting: Obstetrics and Gynecology

## 2015-06-06 LAB — ABO/RH: ABO/RH(D): O NEG

## 2015-06-06 MED ORDER — IBUPROFEN 600 MG PO TABS: 600.0000 mg | ORAL_TABLET | Freq: Four times a day (QID) | ORAL | Status: AC | PRN

## 2015-06-06 MED ORDER — DOCUSATE SODIUM 100 MG PO CAPS: 100.0000 mg | ORAL_CAPSULE | Freq: Two times a day (BID) | ORAL | Status: AC

## 2015-06-06 MED ORDER — OXYCODONE HCL 5 MG PO TABS: 5.0000 mg | ORAL_TABLET | Freq: Four times a day (QID) | ORAL | Status: AC | PRN

## 2015-06-06 MED ORDER — PRENATAL MULTIVITAMIN CH: 1.0000 | ORAL_TABLET | Freq: Every day | ORAL | Status: AC

## 2015-06-06 NOTE — Discharge Instructions (Signed)
Vaginal Delivery, Care After Refer to this sheet in the next few weeks. These discharge instructions provide you with information on caring for yourself after delivery. Your caregiver may also give you specific instructions. Your treatment has been planned according to the most current medical practices available, but problems sometimes occur. Call your caregiver if you have any problems or questions after you go home. HOME CARE INSTRUCTIONS 1. Take over-the-counter or prescription medicines only as directed by your caregiver or pharmacist. 2. Do not drink alcohol, especially if you are breastfeeding or taking medicine to relieve pain. 3. Do not smoke tobacco. 4. Continue to use good perineal care. Good perineal care includes: 1. Wiping your perineum from back to front 2. Keeping your perineum clean. 3. You can do sitz baths twice a day, to help keep this area clean 5. Do not use tampons, douche or have sex until your caregiver says it is okay. 6. Shower only and avoid sitting in submerged water, aside from sitz baths 7. Wear a well-fitting bra that provides breast support. 8. Eat healthy foods. 9. Drink enough fluids to keep your urine clear or pale yellow. 10. Eat high-fiber foods such as whole grain cereals and breads, brown rice, beans, and fresh fruits and vegetables every day. These foods may help prevent or relieve constipation. 11. Avoid constipation with high fiber foods or medications, such as miralax or metamucil 12. Follow your caregiver's recommendations regarding resumption of activities such as climbing stairs, driving, lifting, exercising, or traveling. 13. Talk to your caregiver about resuming sexual activities. Resumption of sexual activities is dependent upon your risk of infection, your rate of healing, and your comfort and desire to resume sexual activity. 14. Try to have someone help you with your household activities and your newborn for at least a few days after you leave  the hospital. 15. Rest as much as possible. Try to rest or take a nap when your newborn is sleeping. 16. Increase your activities gradually. 17. Keep all of your scheduled postpartum appointments. It is very important to keep your scheduled follow-up appointments. At these appointments, your caregiver will be checking to make sure that you are healing physically and emotionally. SEEK MEDICAL CARE IF:   You are passing large clots from your vagina. Save any clots to show your caregiver.  You have a foul smelling discharge from your vagina.  You have trouble urinating.  You are urinating frequently.  You have pain when you urinate.  You have a change in your bowel movements.  You have increasing redness, pain, or swelling near your vaginal incision (episiotomy) or vaginal tear.  You have pus draining from your episiotomy or vaginal tear.  Your episiotomy or vaginal tear is separating.  You have painful, hard, or reddened breasts.  You have a severe headache.  You have blurred vision or see spots.  You feel sad or depressed.  You have thoughts of hurting yourself or your newborn.  You have questions about your care, the care of your newborn, or medicines.  You are dizzy or light-headed.  You have a rash.  You have nausea or vomiting.  You were breastfeeding and have not had a menstrual period within 12 weeks after you stopped breastfeeding.  You are not breastfeeding and have not had a menstrual period by the 12th week after delivery.  You have a fever. SEEK IMMEDIATE MEDICAL CARE IF:   You have persistent pain.  You have chest pain.  You have shortness of breath.    You faint.  You have leg pain.  You have stomach pain.  Your vaginal bleeding saturates two or more sanitary pads in 1 hour. MAKE SURE YOU:   Understand these instructions.  Will watch your condition.  Will get help right away if you are not doing well or get worse. Document Released:  02/08/2000 Document Revised: 06/27/2013 Document Reviewed: 10/08/2011 Exit[employer] Patient Information 2015 [employer]. This information is not intended to replace advice given to you by your health care provider. Make sure you discuss any questions you have with your health care provider.  Sitz Bath A sitz bath is a warm water bath taken in the sitting position. The water covers only the hips and butt (buttocks). We recommend using one that fits in the toilet, to help with ease of use and cleanliness. It may be used for either healing or cleaning purposes. Sitz baths are also used to relieve pain, itching, or muscle tightening (spasms). The water may contain medicine. Moist heat will help you heal and relax.  HOME CARE  Take 3 to 4 sitz baths a day. 18. Fill the bathtub half-full with warm water. 19. Sit in the water and open the drain a little. 20. Turn on the warm water to keep the tub half-full. Keep the water running constantly. 21. Soak in the water for 15 to 20 minutes. 22. After the sitz bath, pat the affected area dry. GET HELP RIGHT AWAY IF: You get worse instead of better. Stop the sitz baths if you get worse. MAKE SURE YOU:  Understand these instructions.  Will watch your condition.  Will get help right away if you are not doing well or get worse. Document Released: 03/20/2004 Document Revised: 11/05/2011 Document Reviewed: 06/10/2010 [employer] Patient Information 2015 [employer]. This information is not intended to replace advice given to you by your health care provider. Make sure you discuss any questions you have with your health care provider.    

## 2015-06-06 NOTE — Progress Notes (Signed)
Patient understands all discharge instructions and the need to make follow up appointments. Patient discharge via wheelchair with auxillary. 

## 2015-06-06 NOTE — Progress Notes (Addendum)
Daily Post Partum Note  Kimberly Riggs is a 30 y.o. [id number] PPD#2 s/p  SVD/intact perineum  @ [redacted]w[redacted]d  POD#1 s/p ppBTL.  Pregnancy c/b Rh negative  24hr/overnight events:  none  Subjective:  Pt doing well. No issues  Objective:    Current Vital Signs 24h Vital Sign Ranges  T 98.2 F (36.8 C) Temp  Avg: 98 F (36.7 C)  Min: 97.4 F (36.3 C)  Max: 98.7 F (37.1 C)  BP 115/69 mmHg BP  Min: 100/76  Max: 123/75  HR 79 Pulse  Avg: 75  Min: 65  Max: 88  RR 18 Resp  Avg: 15.4  Min: 12  Max: 19  SaO2 100 % Not Delivered SpO2  Avg: 99.4 %  Min: 98 %  Max: 100 %       24 Hour I/O Current Shift I/O  Time Ins Outs 04/11 0701 - 04/12 0700 In: 1200 [I.V.:1200] Out: 10       General: NAD Abdomen: +BS, nttp, nd. FF below the umbilicus. C/d/ incision with dermabond over it Perineum: deferred Skin:  Warm and dry.  Cardiovascular: Regular rate and rhythm. Respiratory:  Clear to auscultation bilateral. Normal respiratory effort Extremities: no c/c/e  Medications Current Facility-Administered Medications  Medication Dose Route Frequency Provider Last Rate Last Dose  . acetaminophen (TYLENOL) tablet 650 mg  650 mg Oral Q4H PRN Tresea Mall, CNM      . benzocaine-Menthol (DERMOPLAST) 20-0.5 % topical spray 1 application  1 application Topical PRN Tresea Mall, CNM   1 application at 06/04/15 0246  . witch hazel-glycerin (TUCKS) pad 1 application  1 application Topical PRN Tresea Mall, CNM       And  . dibucaine (NUPERCAINAL) 1 % rectal ointment 1 application  1 application Rectal PRN Tresea Mall, CNM      . diphenhydrAMINE (BENADRYL) capsule 25 mg  25 mg Oral Q6H PRN Tresea Mall, CNM      . ibuprofen (ADVIL,MOTRIN) tablet 600 mg  600 mg Oral 4 times per day Tresea Mall, CNM   600 mg at 06/06/15 4540  . lanolin ointment   Topical PRN Tresea Mall, CNM      . ondansetron (ZOFRAN) tablet 4 mg  4 mg Oral Q4H PRN Tresea Mall, CNM       Or  . ondansetron [facility]) injection 4 mg   4 mg Intravenous Q4H PRN Tresea Mall, CNM   4 mg at 06/05/15 1615  . oxyCODONE (Oxy IR/ROXICODONE) immediate release tablet 10 mg  10 mg Oral Q4H PRN Tresea Mall, CNM      . oxyCODONE (Oxy IR/ROXICODONE) immediate release tablet 5 mg  5 mg Oral Q4H PRN Tresea Mall, CNM      . prenatal multivitamin tablet 1 tablet  1 tablet Oral Q1200 Tresea Mall, CNM   1 tablet at 06/04/15 [phone]  . senna-docusate (Senokot-S) tablet 2 tablet  2 tablet Oral Q24H Tresea Mall, CNM   2 tablet at 06/06/15 0024  . simethicone (MYLICON) chewable tablet 80 mg  80 mg Oral PRN Tresea Mall, CNM        Labs:   Recent Labs Lab 06/03/15 2208 06/04/15 0450  WBC 9.9 17.0*  HGB 10.4* 10.5*  HCT 30.4* 31.0*  PLT 206 201   No results for input(s): NA, K, CL, CO2, BUN, CREATININE, LABGLOM, GLUCOSE, CALCIUM in the last 168 hours.  Assessment & Plan:  Pt doing well *Postpartum/postop: routine care *Rh neg: infant is Rh negative too *Dispo: today  O NEG / Rubella NI: pt states she always gets this and the varicella but never is immune. Pt declines both / Varicella Not immune/  RPR negative / HIV negative / HepBsAg negative / Tdap UTD: yes/ pap neg 2016 / Breast  / Contraception: f/u BTL path / Follow up: Johnsie Riggs  Domenique Southers, Jr. MD [facility] Pager [phone]

## 2015-06-07 LAB — SURGICAL PATHOLOGY

## 2017-06-04 IMAGING — MR MR ABDOMEN W/O CM
7 series · 48 of 48 positions shown · non-contrast
Comparison: None.

CLINICAL DATA: Leukocytosis. Third trimester pregnancy. Right lower
quadrant abdominal pain starting [REDACTED].

EXAM:
MRI ABDOMEN AND PELVIS WITHOUT CONTRAST
TECHNIQUE: Multiplanar multisequence MR imaging of the abdomen and pelvis was
performed. No intravenous contrast was administered.

[Series 3: ax haste fs · axial · 6.0mm · 1.48mm/px · z∈[-119,+241]mm · 6 of 51 slices shown]
[im 1/51]
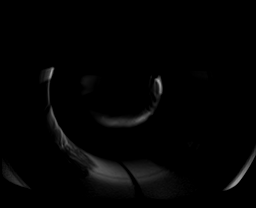
[im 11/51]
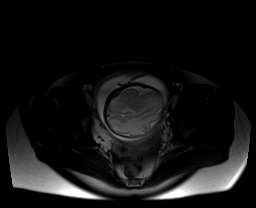
[im 21/51]
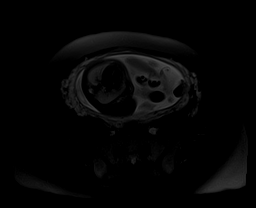
[im 31/51]
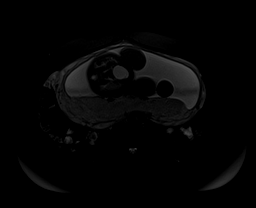
[im 41/51]
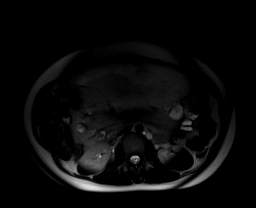
[im 51/51]
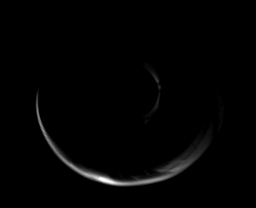

[Series 6: cor haste · coronal · 6.0mm · 1.19mm/px · 4 of 35 slices shown]
[im 1/35]
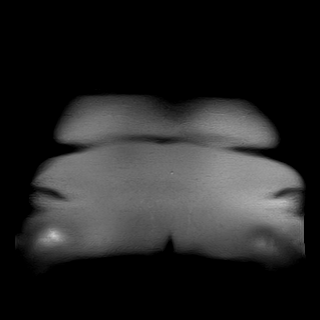
[im 12/35]
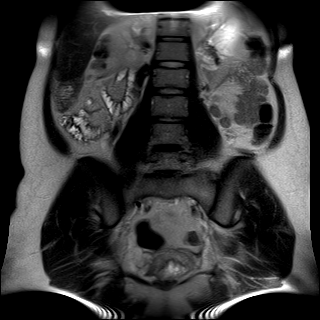
[im 23/35]
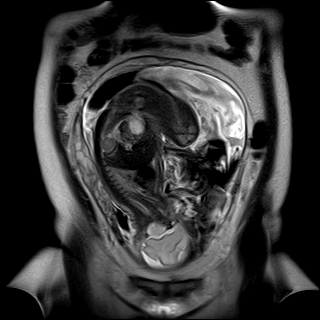
[im 35/35]
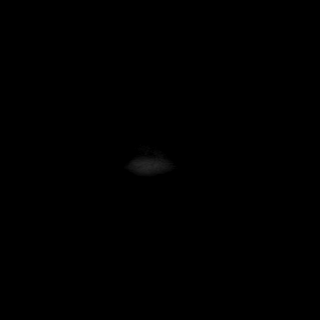

[Series 8: cor haste fs · coronal · 6.0mm · 1.19mm/px · 4 of 35 slices shown]
[im 1/35]
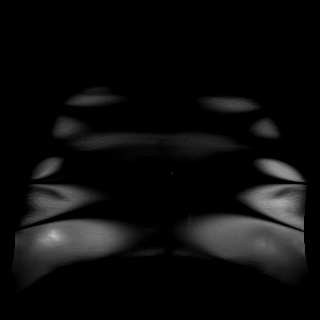
[im 12/35]
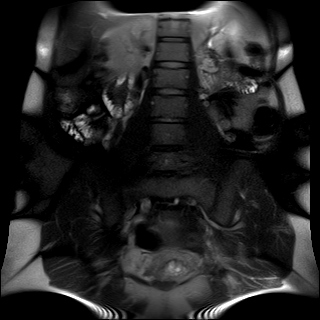
[im 23/35]
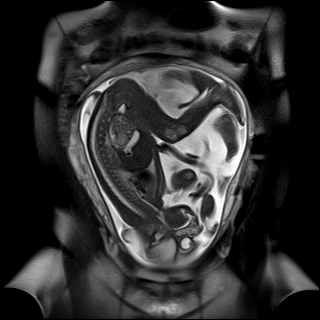
[im 35/35]
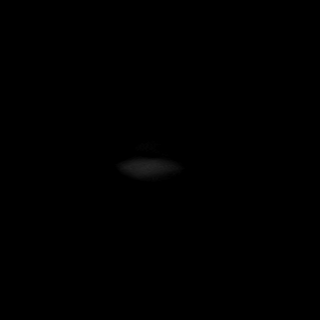

[Series 10: ax haste · axial · 6.0mm · 1.19mm/px · z∈[-119,+241]mm · 6 of 51 slices shown]
[im 1/51]
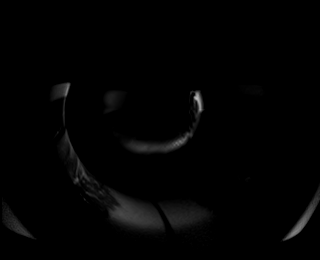
[im 11/51]
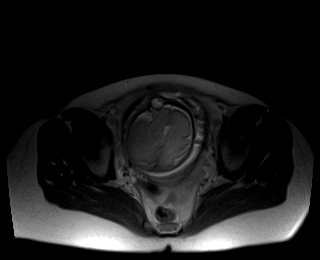
[im 21/51]
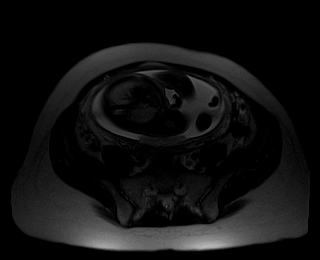
[im 31/51]
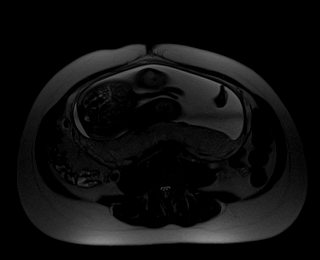
[im 41/51]
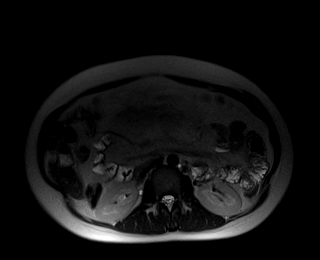
[im 51/51]
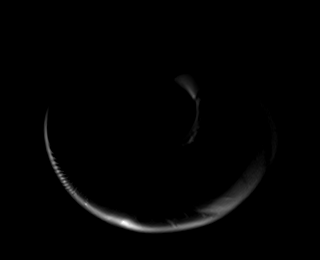

[Series 12: t2_trufi_tra_p2_bh_4mm · axial · 5.0mm · 1.48mm/px · z∈[-114,+236]mm · 8 of 71 slices shown]
[im 1/71]
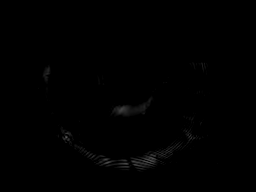
[im 11/71]
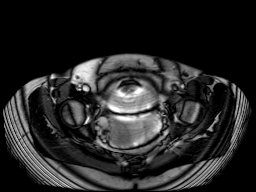
[im 21/71]
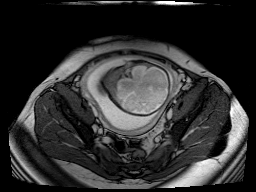
[im 31/71]
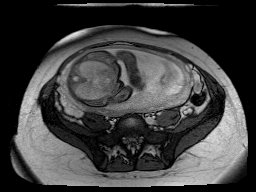
[im 41/71]
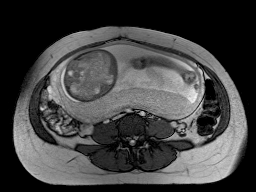
[im 51/71]
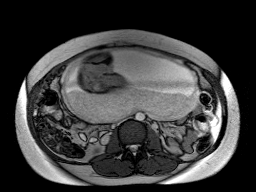
[im 61/71]
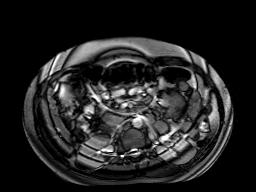
[im 71/71]
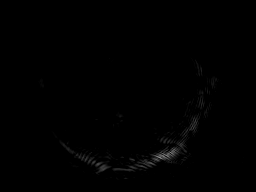

[Series 14: t2_trufi_cor_p2_bh_4mm · coronal · 5.0mm · 1.48mm/px · 6 of 50 slices shown]
[im 1/50]
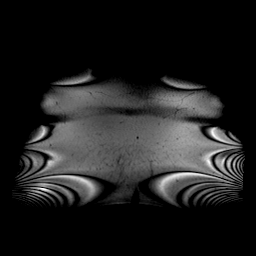
[im 10/50]
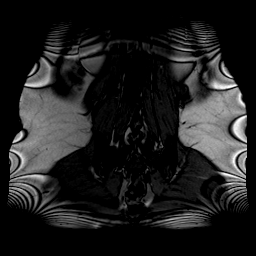
[im 20/50]
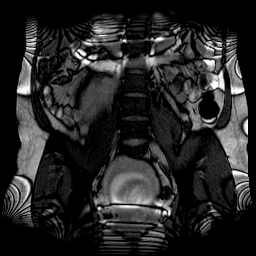
[im 30/50]
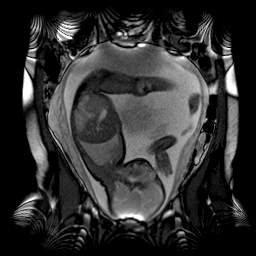
[im 40/50]
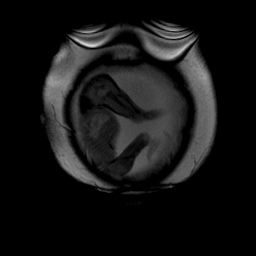
[im 50/50]
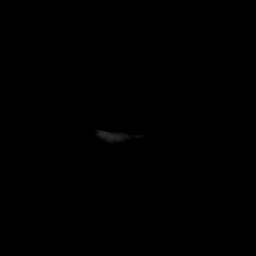

[Series 15: T1 · axial · 3.0mm · 1.19mm/px · z∈[-117,+240]mm · 14 of 120 slices shown]
[im 1/120]
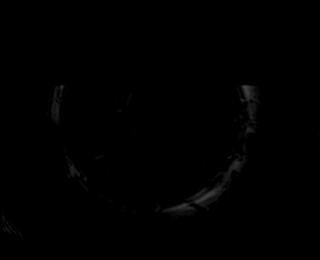
[im 10/120]
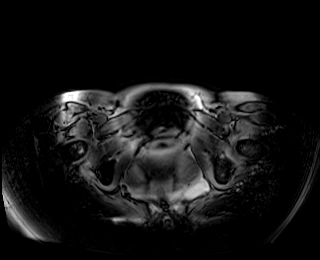
[im 19/120]
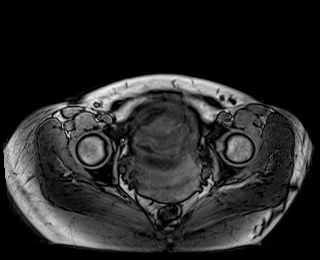
[im 28/120]
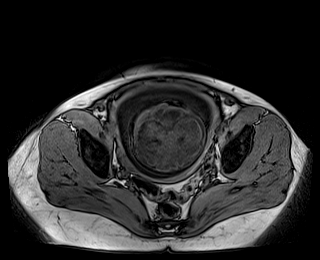
[im 37/120]
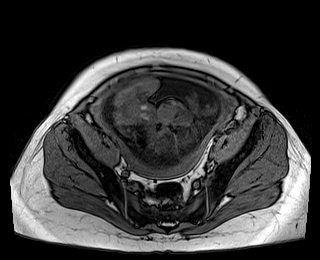
[im 46/120]
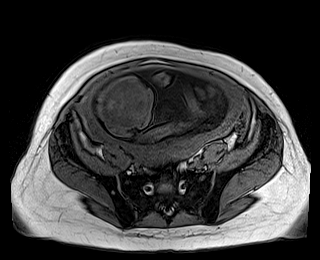
[im 55/120]
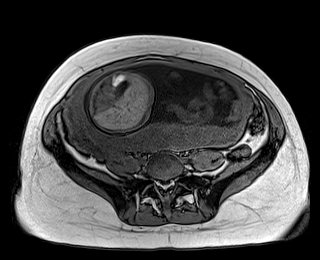
[im 65/120]
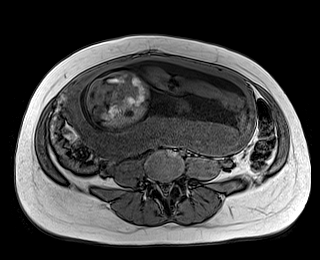
[im 74/120]
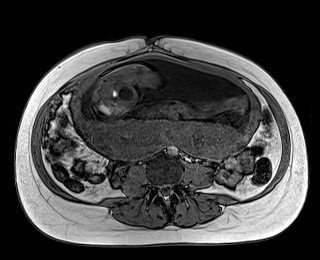
[im 83/120]
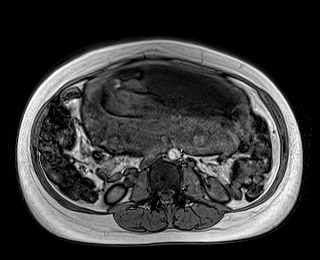
[im 92/120]
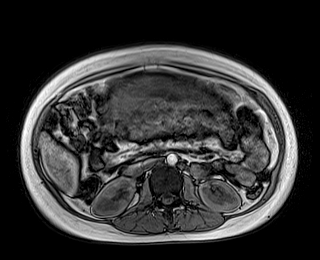
[im 101/120]
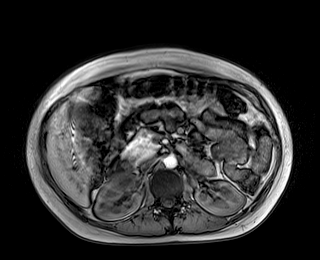
[im 110/120]
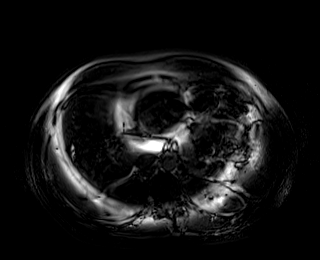
[im 120/120]
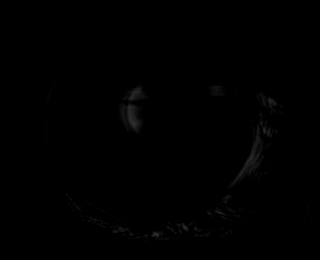

[48 of 48 positions shown; findings below may reference images not displayed]

FINDINGS: Today's exam was performed by necessity on the short poor magnet.
The short bore leads to some limitations in assessing the upper
abdomen, with significant portions of the liver, pancreas, spleen,
and upper zones of the kidneys excluded or distorted.

MRI ABDOMEN FINDINGS

No biliary dilatation or hydronephrosis is observed. I do not see
any definite gallstones or gallbladder wall thickening. No dilated
bowel. There is no dilated bowel in the upper abdomen.

Cecum just below the gallbladder fossa. Tubular structure adjacent
to the cecum likely representing normal appendix observed on image
20 series 12. I do not see compelling findings for an inflamed
tubular structure to suggest acute appendicitis. Prominent
parametrial vasculature noted bilaterally. No dilated bowel. The
gravid uterus somewhat compresses the IVC.

MRI PELVIS FINDINGS

Empty urinary bladder. Colon unremarkable. No significant adnexal
abnormality observed.

Limited fetal assessment demonstrates expected amount of amniotic
fluid, posterior placenta without previa, cephalic presentation,
left oriented cardiac apex, expected appearance of the urinary
bladder, fetal kidneys, and gallbladder. No compelling findings of
neural tube defect or other specific abnormality identified.

No hip effusion or compelling evidence of abnormal osseous signal in
the hips or bony pelvis.
IMPRESSION: 1. A specific cause for the patient's right lower quadrant pain is
not identified. The cecum is just below the gallbladder fossa and
there is a candidate structure for normal appendix adjacent to the
cecum ; no discrete inflammatory process in the right lower quadrant
is identified nor is there significant right hydroureter. The gravid
uterus does compress the IVC as the patient is imaged in the supine
position.
2. Limited fetal assessment demonstrates no appreciated abnormality.
These results will be called to the ordering clinician or
representative by the Radiologist Assistant, and communication
documented in the PACS or zVision Dashboard.
